# Patient Record
Sex: Female | Born: 1960 | Race: White | Hispanic: No | Marital: Married | Smoking: Never smoker
Health system: Southern US, Community
[De-identification: ages and names within clinical notes are randomized; demographics above are authoritative.]

## PROBLEM LIST (undated history)

## (undated) DIAGNOSIS — C3490 Malignant neoplasm of unspecified part of unspecified bronchus or lung: Secondary | ICD-10-CM

## (undated) DIAGNOSIS — E079 Disorder of thyroid, unspecified: Secondary | ICD-10-CM

## (undated) DIAGNOSIS — J45909 Unspecified asthma, uncomplicated: Secondary | ICD-10-CM

## (undated) DIAGNOSIS — N3010 Interstitial cystitis (chronic) without hematuria: Secondary | ICD-10-CM

## (undated) DIAGNOSIS — R011 Cardiac murmur, unspecified: Secondary | ICD-10-CM

## (undated) HISTORY — DX: Unspecified asthma, uncomplicated: J45.909

## (undated) HISTORY — DX: Malignant neoplasm of unspecified part of unspecified bronchus or lung: C34.90

## (undated) HISTORY — PX: ABDOMINAL HYSTERECTOMY: SHX81

## (undated) HISTORY — DX: Cardiac murmur, unspecified: R01.1

## (undated) HISTORY — DX: Disorder of thyroid, unspecified: E07.9

## (undated) HISTORY — PX: BREAST SURGERY: SHX581

## (undated) HISTORY — DX: Interstitial cystitis (chronic) without hematuria: N30.10

---

## 2003-09-19 ENCOUNTER — Encounter: Admission: RE | Admit: 2003-09-19 | Discharge: 2003-12-18 | Payer: Self-pay | Admitting: Anesthesiology

## 2003-12-17 ENCOUNTER — Encounter
Admission: RE | Admit: 2003-12-17 | Discharge: 2004-03-16 | Payer: Self-pay | Admitting: Physical Medicine and Rehabilitation

## 2005-09-05 HISTORY — PX: CHOLECYSTECTOMY: SHX55

## 2007-09-06 HISTORY — PX: LUNG SURGERY: SHX703

## 2011-01-06 ENCOUNTER — Ambulatory Visit: Payer: Self-pay | Admitting: Cardiothoracic Surgery

## 2011-02-04 ENCOUNTER — Ambulatory Visit: Payer: Self-pay | Admitting: Cardiothoracic Surgery

## 2012-01-12 ENCOUNTER — Ambulatory Visit: Payer: Self-pay | Admitting: Cardiothoracic Surgery

## 2012-02-04 ENCOUNTER — Ambulatory Visit: Payer: Self-pay | Admitting: Cardiothoracic Surgery

## 2012-07-26 ENCOUNTER — Ambulatory Visit: Payer: Self-pay | Admitting: Cardiothoracic Surgery

## 2013-09-26 ENCOUNTER — Ambulatory Visit: Payer: Self-pay | Admitting: Cardiothoracic Surgery

## 2013-10-06 ENCOUNTER — Ambulatory Visit: Payer: Self-pay | Admitting: Cardiothoracic Surgery

## 2014-11-06 ENCOUNTER — Ambulatory Visit
Admit: 2014-11-06 | Disposition: A | Discharge status: Home / Self Care | Payer: Self-pay | Attending: Cardiothoracic Surgery | Admitting: Cardiothoracic Surgery

## 2014-11-06 ENCOUNTER — Ambulatory Visit: Payer: Self-pay | Admitting: Cardiothoracic Surgery

## 2014-12-05 ENCOUNTER — Ambulatory Visit
Admit: 2014-12-05 | Disposition: A | Discharge status: Home / Self Care | Payer: Self-pay | Attending: Cardiothoracic Surgery | Admitting: Cardiothoracic Surgery

## 2015-10-13 ENCOUNTER — Telehealth: Payer: Self-pay | Admitting: Cardiothoracic Surgery

## 2015-10-13 NOTE — Telephone Encounter (Signed)
Patient has called and stated that she recently seen her PCP Launa Grill) and was told that she could possibly have liver cancer due to lab work that was recently done --- Alkaline: 133  ALT: 42. Patient is very concerned. She has been seeing you for lung cancer. She would like for Dr Genevive Bi to call her to share his thoughts. If you dont mind contacting her at [phone] at your convenience.

## 2015-10-14 ENCOUNTER — Telehealth: Payer: Self-pay

## 2015-10-14 NOTE — Telephone Encounter (Signed)
  Oncology Nurse Navigator Documentation  Navigator Location: [facility] (10/14/15 1600) Navigator Encounter Type: Telephone (10/14/15 1600)                   Interventions: Referrals;Coordination of Care (10/14/15 1600)                      Time Spent with Patient: 30 (10/14/15 1600)   Called office of Dr Launa Grill in [place], [phone]). Spoke with Legrand Como who states she had an appt yesterday to go over a CT scan and had elevated liver enzymes. He reports that the referral is not batched and ready to be sent out. He will send to my attention. Also requested that a copy of CT be obtained and sent with patient.

## 2015-10-15 ENCOUNTER — Telehealth: Payer: Self-pay

## 2015-10-15 NOTE — Telephone Encounter (Signed)
Oncology Nurse Navigator Documentation  Oncology Nurse Navigator Flowsheets 10/14/2015 10/15/2015  Navigator Location [facility]  Navigator Encounter Type Telephone Telephone  Interventions Referrals;Coordination of Care Referrals  Time Spent with Patient 30 15   Notes obtained from referring MD for Dr Genevive Bi. Appt made and patient notified of appt 2/16/117 at 1445

## 2015-10-22 ENCOUNTER — Ambulatory Visit: Payer: Self-pay | Admitting: Cardiothoracic Surgery

## 2015-10-26 ENCOUNTER — Encounter: Payer: Self-pay | Admitting: Oncology

## 2015-10-26 ENCOUNTER — Inpatient Hospital Stay: Payer: Self-pay | Attending: Oncology | Admitting: Oncology

## 2015-10-26 VITALS — BP 127/85 | HR 77 | Temp 98.1°F | Resp 16 | Wt 191.8 lb

## 2015-10-26 DIAGNOSIS — Z902 Acquired absence of lung [part of]: Secondary | ICD-10-CM | POA: Insufficient documentation

## 2015-10-26 DIAGNOSIS — Z9049 Acquired absence of other specified parts of digestive tract: Secondary | ICD-10-CM | POA: Insufficient documentation

## 2015-10-26 DIAGNOSIS — R748 Abnormal levels of other serum enzymes: Secondary | ICD-10-CM | POA: Insufficient documentation

## 2015-10-26 DIAGNOSIS — E079 Disorder of thyroid, unspecified: Secondary | ICD-10-CM | POA: Insufficient documentation

## 2015-10-26 DIAGNOSIS — J45909 Unspecified asthma, uncomplicated: Secondary | ICD-10-CM | POA: Insufficient documentation

## 2015-10-26 DIAGNOSIS — Z90710 Acquired absence of both cervix and uterus: Secondary | ICD-10-CM | POA: Insufficient documentation

## 2015-10-26 DIAGNOSIS — Z85118 Personal history of other malignant neoplasm of bronchus and lung: Secondary | ICD-10-CM | POA: Insufficient documentation

## 2015-10-26 DIAGNOSIS — Z79899 Other long term (current) drug therapy: Secondary | ICD-10-CM | POA: Insufficient documentation

## 2015-10-26 NOTE — Progress Notes (Signed)
Patient is an established patient of Dr. Genevive Bi for f/u lung cancer.  On recent lab check by PCP her liver functions were elevated and they would like for her to be evaluated for "possible liver cancer".

## 2015-10-30 NOTE — Progress Notes (Signed)
[place]  [phone] [phone]  ID: Jacqueline Mcneil OB: May 12, 1961  MR#: [id number]  HFW#:[id number]  Patient Care Team: Launa Grill, MD as PCP - General  CHIEF COMPLAINT:  Chief Complaint  Patient presents with  . New Evaluation    elevated liver functions    INTERVAL HISTORY: Patient is a 55 year old female with a distant history of lung cancer status post resection in 2009. Patient recently was found to have abnormal liver enzymes and has been referred for further evaluation and to assess for possible recurrence of disease. She currently feels well and is asymptomatic. She has no neurologic complaint. She has a good appetite and denies weight loss. She denies any recent fevers or illnesses. She has no chest pain, cough, shortness of breath, or hemoptysis. She denies any nausea, vomiting, constipation, or diarrhea. She has no urinary complaints. Patient feels at her baseline and offers no specific complaints today.  REVIEW OF SYSTEMS:   Review of Systems  Constitutional: Negative.  Negative for fever, weight loss and malaise/fatigue.  Respiratory: Negative.  Negative for cough, hemoptysis and shortness of breath.   Cardiovascular: Negative.  Negative for chest pain.  Gastrointestinal: Negative.  Negative for abdominal pain.  Genitourinary: Negative.   Musculoskeletal: Negative.   Neurological: Negative.  Negative for weakness.    As per HPI. Otherwise, a complete review of systems is negatve.  PAST MEDICAL HISTORY: Past Medical History  Diagnosis Date  . Asthma   . Lung cancer ([place])   . Heart murmur   . Thyroid disease   . Interstitial cystitis     PAST SURGICAL HISTORY: Past Surgical History  Procedure Laterality Date  . Breast surgery  [id number]  . Abdominal hysterectomy    . Cholecystectomy  2007  . Lung surgery  2009    FAMILY HISTORY: Reviewed and unchanged. No reported history of malignancy or chronic  disease.     ADVANCED DIRECTIVES:    HEALTH MAINTENANCE: Social History  Substance Use Topics  . Smoking status: Never Smoker   . Smokeless tobacco: Never Used  . Alcohol Use: No     Colonoscopy:  PAP:  Bone density:  Lipid panel:  Allergies  Allergen Reactions  . Latex Rash  . Penicillins Rash and Swelling    Current Outpatient Prescriptions  Medication Sig Dispense Refill  . albuterol (PROVENTIL) (5 MG/ML) 0.5% nebulizer solution Inhale into the lungs.    . cyclobenzaprine (FLEXERIL) 5 MG tablet 1 TABLET THREE TIMES A DAY ORALLY 30 DAYS  0  . hydrOXYzine (VISTARIL) 25 MG capsule TAKE 1 TO 2 CAPSULES EVERY 6 HOURS AS NEEDED FOR MUSCLE SPASMS.  1  . levothyroxine (SYNTHROID, LEVOTHROID) 25 MCG tablet TAKE 1 TABLET ONCE A DAY ORAL 90 DAYS  1  . sertraline (ZOLOFT) 25 MG tablet 1 TABLET TWICE A DAY ORALLY 30 DAYS  6  . traZODone (DESYREL) 100 MG tablet TAKE 1 TABLET BEDTIME BEDTIME ORAL 90  1   No current facility-administered medications for this visit.    OBJECTIVE: Filed Vitals:   10/26/15 1522  BP: 127/85  Pulse: 77  Temp: 98.1 F (36.7 C)  Resp: 16     There is no height on file to calculate BMI.    ECOG FS:0 - Asymptomatic  General: Well-developed, well-nourished, no acute distress. Eyes: Pink conjunctiva, anicteric sclera. HEENT: Normocephalic, moist mucous membranes, clear oropharnyx. Lungs: Clear to auscultation bilaterally. Heart: Regular rate and rhythm. No rubs, murmurs, or gallops. Abdomen: Soft, nontender, nondistended.  No organomegaly noted, normoactive bowel sounds. Musculoskeletal: No edema, cyanosis, or clubbing. Neuro: Alert, answering all questions appropriately. Cranial nerves grossly intact. Skin: No rashes or petechiae noted. Psych: Normal affect. Lymphatics: No cervical, calvicular, axillary or inguinal LAD.   LAB RESULTS:  No results found for: NA, K, CL, CO2, GLUCOSE, BUN, CREATININE, CALCIUM, PROT, ALBUMIN, AST, ALT, ALKPHOS,  BILITOT, GFRNONAA, GFRAA  No results found for: WBC, NEUTROABS, HGB, HCT, MCV, PLT   STUDIES: No results found.  ASSESSMENT: History of lung cancer, abnormal liver enzymes.  PLAN:    1. Lung cancer: Patient had resection in 2009 with no evidence of recurrence. Given her abnormal liver functions, will repeat CT scan for restaging purposes which will include an abdominal and pelvic CT to further evaluate her liver. We will also repeat liver function tests at next clinic visit. Return to clinic on November 12, 2015 for further evaluation by both medical oncology and surgical oncology. If patient's CT scan is within normal limits, she likely can be discharged from clinic.  Patient expressed understanding and was in agreement with this plan. She also understands that She can call clinic at any time with any questions, concerns, or complaints.    Lloyd Huger, MD   10/30/2015 2:51 PM

## 2015-11-12 ENCOUNTER — Ambulatory Visit: Payer: Self-pay | Admitting: Cardiothoracic Surgery

## 2015-11-12 ENCOUNTER — Inpatient Hospital Stay: Payer: Self-pay | Admitting: Cardiothoracic Surgery

## 2015-11-12 ENCOUNTER — Inpatient Hospital Stay: Payer: Self-pay | Admitting: Oncology

## 2015-11-12 ENCOUNTER — Ambulatory Visit
Admission: RE | Admit: 2015-11-12 | Discharge: 2015-11-12 | Disposition: A | Discharge status: Home / Self Care | Payer: Self-pay | Source: Ambulatory Visit | Attending: Oncology | Admitting: Oncology

## 2015-11-12 ENCOUNTER — Ambulatory Visit: Payer: Self-pay | Admitting: Oncology

## 2015-11-26 ENCOUNTER — Inpatient Hospital Stay: Payer: Self-pay | Attending: Cardiothoracic Surgery | Admitting: Cardiothoracic Surgery

## 2015-11-26 ENCOUNTER — Ambulatory Visit
Admission: RE | Admit: 2015-11-26 | Discharge: 2015-11-26 | Disposition: A | Discharge status: Home / Self Care | Payer: Self-pay | Source: Ambulatory Visit | Attending: Oncology | Admitting: Oncology

## 2015-11-26 ENCOUNTER — Inpatient Hospital Stay (HOSPITAL_BASED_OUTPATIENT_CLINIC_OR_DEPARTMENT_OTHER): Payer: Self-pay | Admitting: Oncology

## 2015-11-26 VITALS — BP 145/92 | HR 90 | Temp 97.4°F | Resp 16 | Wt 192.7 lb

## 2015-11-26 DIAGNOSIS — R911 Solitary pulmonary nodule: Secondary | ICD-10-CM | POA: Insufficient documentation

## 2015-11-26 DIAGNOSIS — Z85118 Personal history of other malignant neoplasm of bronchus and lung: Secondary | ICD-10-CM | POA: Insufficient documentation

## 2015-11-26 DIAGNOSIS — R748 Abnormal levels of other serum enzymes: Secondary | ICD-10-CM

## 2015-11-26 DIAGNOSIS — N200 Calculus of kidney: Secondary | ICD-10-CM | POA: Insufficient documentation

## 2015-11-26 DIAGNOSIS — K760 Fatty (change of) liver, not elsewhere classified: Secondary | ICD-10-CM | POA: Insufficient documentation

## 2015-11-26 DIAGNOSIS — Z902 Acquired absence of lung [part of]: Secondary | ICD-10-CM | POA: Insufficient documentation

## 2015-11-26 DIAGNOSIS — Z79899 Other long term (current) drug therapy: Secondary | ICD-10-CM

## 2015-11-26 MED ORDER — IOPAMIDOL (ISOVUE-300) INJECTION 61%
100.0000 mL | Freq: Once | INTRAVENOUS | Status: AC | PRN
  Administered 2015-11-26: 100 mL via INTRAVENOUS

## 2015-11-26 NOTE — Progress Notes (Signed)
Patient does not offer any problems today.  

## 2015-12-11 NOTE — Progress Notes (Signed)
[place]  [phone] [phone]  ID: Jacqueline Mcneil OB: 1960-10-02  MR#: [id number]  UUV#:[id number]  Patient Care Team: Evelene Croon, MD as PCP - General  CHIEF COMPLAINT:  Chief Complaint  Patient presents with  . Results    INTERVAL HISTORY: Patient returns to clinic today for further evaluation and discussion of her imaging results. She continues to feel well and is asymptomatic. She has no neurologic complaint. She has a good appetite and denies weight loss. She denies any recent fevers or illnesses. She has no chest pain, cough, shortness of breath, or hemoptysis. She denies any nausea, vomiting, constipation, or diarrhea. She has no urinary complaints. Patient feels at her baseline and offers no specific complaints today.  REVIEW OF SYSTEMS:   Review of Systems  Constitutional: Negative.  Negative for fever, weight loss and malaise/fatigue.  Respiratory: Negative.  Negative for cough, hemoptysis and shortness of breath.   Cardiovascular: Negative.  Negative for chest pain.  Gastrointestinal: Negative.  Negative for abdominal pain.  Genitourinary: Negative.   Musculoskeletal: Negative.   Neurological: Negative.  Negative for weakness.    As per HPI. Otherwise, a complete review of systems is negatve.  PAST MEDICAL HISTORY: Past Medical History  Diagnosis Date  . Asthma   . Lung cancer ([place])   . Heart murmur   . Thyroid disease   . Interstitial cystitis     PAST SURGICAL HISTORY: Past Surgical History  Procedure Laterality Date  . Breast surgery  [id number]  . Abdominal hysterectomy    . Cholecystectomy  2007  . Lung surgery  2009    FAMILY HISTORY: Reviewed and unchanged. No reported history of malignancy or chronic disease.     ADVANCED DIRECTIVES:    HEALTH MAINTENANCE: Social History  Substance Use Topics  . Smoking status: Never Smoker   . Smokeless tobacco: Never Used  . Alcohol Use: No      Colonoscopy:  PAP:  Bone density:  Lipid panel:  Allergies  Allergen Reactions  . Latex Rash  . Penicillins Rash and Swelling    Current Outpatient Prescriptions  Medication Sig Dispense Refill  . albuterol (PROVENTIL) (5 MG/ML) 0.5% nebulizer solution Inhale into the lungs.    . cyclobenzaprine (FLEXERIL) 5 MG tablet 1 TABLET THREE TIMES A DAY ORALLY 30 DAYS  0  . hydrOXYzine (VISTARIL) 25 MG capsule TAKE 1 TO 2 CAPSULES EVERY 6 HOURS AS NEEDED FOR MUSCLE SPASMS.  1  . levothyroxine (SYNTHROID, LEVOTHROID) 25 MCG tablet TAKE 1 TABLET ONCE A DAY ORAL 90 DAYS  1  . sertraline (ZOLOFT) 25 MG tablet 1 TABLET TWICE A DAY ORALLY 30 DAYS  6  . traZODone (DESYREL) 100 MG tablet TAKE 1 TABLET BEDTIME BEDTIME ORAL 90  1   No current facility-administered medications for this visit.    OBJECTIVE: Filed Vitals:   11/26/15 1341  BP: 145/92  Pulse: 90  Temp: 97.4 F (36.3 C)  Resp: 16     There is no height on file to calculate BMI.    ECOG FS:0 - Asymptomatic  General: Well-developed, well-nourished, no acute distress. Eyes: Pink conjunctiva, anicteric sclera. Lungs: Clear to auscultation bilaterally. Heart: Regular rate and rhythm. No rubs, murmurs, or gallops. Abdomen: Soft, nontender, nondistended. No organomegaly noted, normoactive bowel sounds. Musculoskeletal: No edema, cyanosis, or clubbing. Neuro: Alert, answering all questions appropriately. Cranial nerves grossly intact. Skin: No rashes or petechiae noted. Psych: Normal affect.   LAB RESULTS:  No results found  for: NA, K, CL, CO2, GLUCOSE, BUN, CREATININE, CALCIUM, PROT, ALBUMIN, AST, ALT, ALKPHOS, BILITOT, GFRNONAA, GFRAA  No results found for: WBC, NEUTROABS, HGB, HCT, MCV, PLT   STUDIES: Ct Chest W Contrast  11/26/2015  CLINICAL DATA:  Lung cancer diagnosed in 2007, recent abnormal elevation of liver enzymes. EXAM: CT CHEST AND ABDOMEN WITHOUT CONTRAST TECHNIQUE: Multidetector CT imaging of the chest and  abdomen was performed following the standard protocol without intravenous contrast. COMPARISON:  Multiple exams, including 11/06/2014 FINDINGS: CT CHEST FINDINGS Mediastinum/Nodes: Trace fluid in the superior pericardial recesses. There is some oral contrast medium in the thoracic esophagus which could be due to dysmotility or gastroesophageal reflux. No pathologic thoracic adenopathy. Lungs/Pleura: Questionable 2 mm nodule in the right upper lobe image 19/4, no change from 01/06/2011, benign. Prior left upper lobectomy. 4 mm peripheral nodule in the left lower lobe image 21/4, no change from 01/06/2011, benign. Minimal scarring in the left lung apex. Minimal scarring lateral to the left cardiac margin, no change from 2012. Musculoskeletal: Mild thoracic spondylosis. CT ABDOMEN FINDINGS Hepatobiliary: Mild diffuse hepatic steatosis. No focal liver lesion. Cholecystectomy. Pancreas: Unremarkable Spleen: Unremarkable.  Several accessory spleens incidentally noted. Adrenals/Urinary Tract: Adrenal glands normal. 2 mm nonobstructive right mid kidney calculus, image 60/2. Right greater than left extrarenal pelvis incidentally noted. Stomach/Bowel: Unremarkable where visualized. Vascular/Lymphatic: Unremarkable Other: No supplemental non-categorized findings. Musculoskeletal: Diffuse disc bulge at the L4-5 level. IMPRESSION: 1. No findings of recurrent malignancy. 2. Mild diffuse hepatic steatosis. No focal liver lesion identified. 3. 2 mm nonobstructive right mid kidney calculus. 4. Diffuse disc bulge at L4-5. 5. The patient has a small peripheral left lower lobe nodule on image 21 series 4 which is unchanged from 2012 and benign. Prior left upper lobectomy. Electronically Signed   By: Van Clines M.D.   On: 11/26/2015 14:30   Ct Abdomen W Contrast  11/26/2015  CLINICAL DATA:  Lung cancer diagnosed in 2007, recent abnormal elevation of liver enzymes. EXAM: CT CHEST AND ABDOMEN WITHOUT CONTRAST TECHNIQUE:  Multidetector CT imaging of the chest and abdomen was performed following the standard protocol without intravenous contrast. COMPARISON:  Multiple exams, including 11/06/2014 FINDINGS: CT CHEST FINDINGS Mediastinum/Nodes: Trace fluid in the superior pericardial recesses. There is some oral contrast medium in the thoracic esophagus which could be due to dysmotility or gastroesophageal reflux. No pathologic thoracic adenopathy. Lungs/Pleura: Questionable 2 mm nodule in the right upper lobe image 19/4, no change from 01/06/2011, benign. Prior left upper lobectomy. 4 mm peripheral nodule in the left lower lobe image 21/4, no change from 01/06/2011, benign. Minimal scarring in the left lung apex. Minimal scarring lateral to the left cardiac margin, no change from 2012. Musculoskeletal: Mild thoracic spondylosis. CT ABDOMEN FINDINGS Hepatobiliary: Mild diffuse hepatic steatosis. No focal liver lesion. Cholecystectomy. Pancreas: Unremarkable Spleen: Unremarkable.  Several accessory spleens incidentally noted. Adrenals/Urinary Tract: Adrenal glands normal. 2 mm nonobstructive right mid kidney calculus, image 60/2. Right greater than left extrarenal pelvis incidentally noted. Stomach/Bowel: Unremarkable where visualized. Vascular/Lymphatic: Unremarkable Other: No supplemental non-categorized findings. Musculoskeletal: Diffuse disc bulge at the L4-5 level. IMPRESSION: 1. No findings of recurrent malignancy. 2. Mild diffuse hepatic steatosis. No focal liver lesion identified. 3. 2 mm nonobstructive right mid kidney calculus. 4. Diffuse disc bulge at L4-5. 5. The patient has a small peripheral left lower lobe nodule on image 21 series 4 which is unchanged from 2012 and benign. Prior left upper lobectomy. Electronically Signed   By: Van Clines M.D.   On: 11/26/2015  14:30    ASSESSMENT: History of lung cancer, abnormal liver enzymes.  PLAN:    1. Lung cancer: Patient had resection in 2009 with no evidence of  recurrence. CT scan results reviewed independently and reported as above with no obvious evidence of recurrence for malignancy. No further imaging or follow-up is necessary. Please refer patient back if there are any questions or concerns.   Patient expressed understanding and was in agreement with this plan. She also understands that She can call clinic at any time with any questions, concerns, or complaints.    Lloyd Huger, MD   12/11/2015 2:33 PM

## 2016-07-17 IMAGING — CT CT ABDOMEN W/ CM
1 series · 15 of 32 positions shown, 19 images · non-contrast
Comparison: Multiple exams, including 11/06/2014

CLINICAL DATA: Lung cancer diagnosed in 5558, recent abnormal
elevation of liver enzymes.

EXAM:
CT CHEST AND ABDOMEN WITHOUT CONTRAST
TECHNIQUE: Multidetector CT imaging of the chest and abdomen was performed
following the standard protocol without intravenous contrast.

[Series 8: delay · axial · delayed · 0.78mm/px · z∈[-444,-264]mm · 15 of 41 slices shown, 19 images]
[im 3/41  soft-tissue]
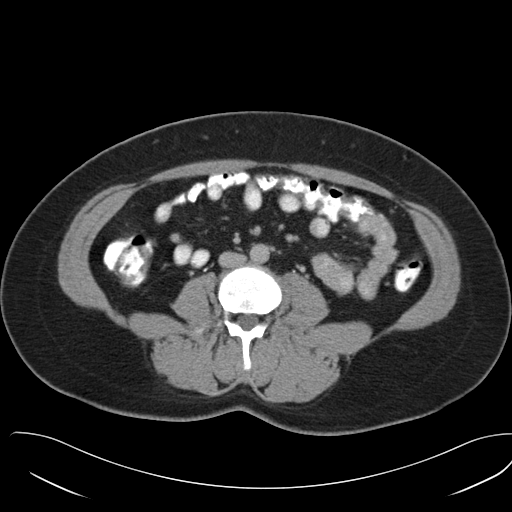
[im 3/41  bone]
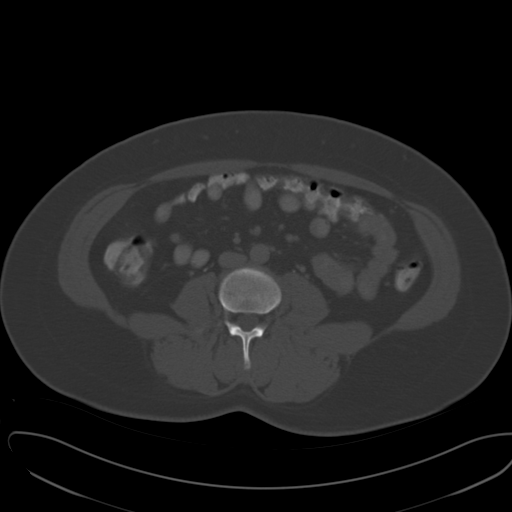
[im 6/41  soft-tissue]
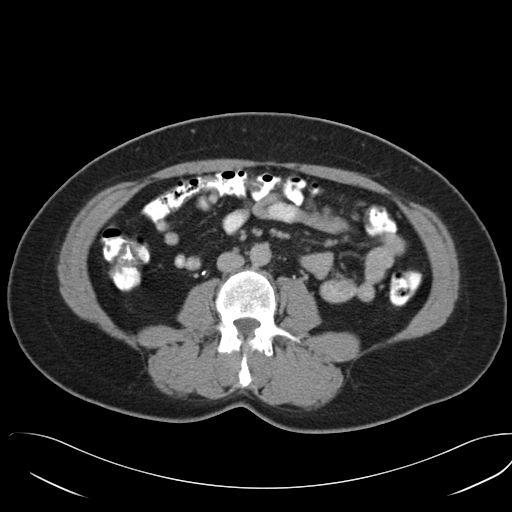
[im 8/41  soft-tissue]
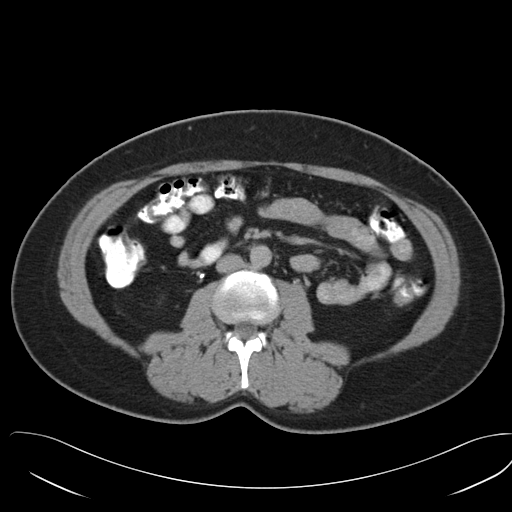
[im 12/41  soft-tissue]
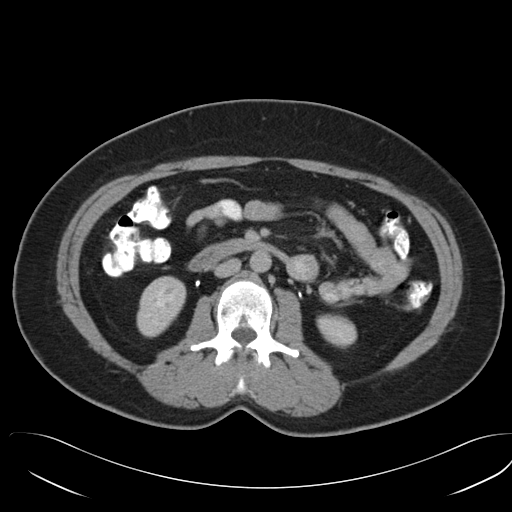
[im 15/41  soft-tissue]
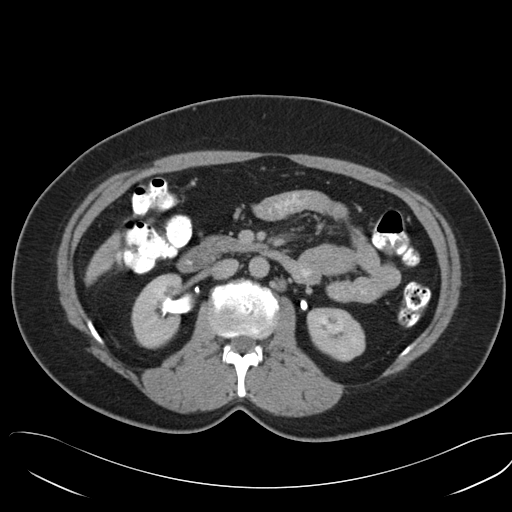
[im 17/41  soft-tissue]
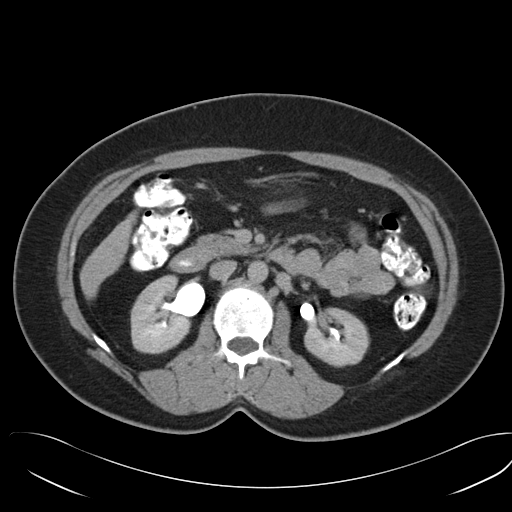
[im 21/41  soft-tissue]
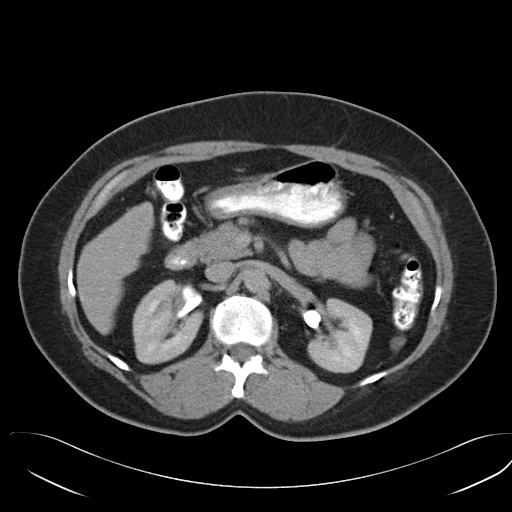
[im 24/41  soft-tissue]
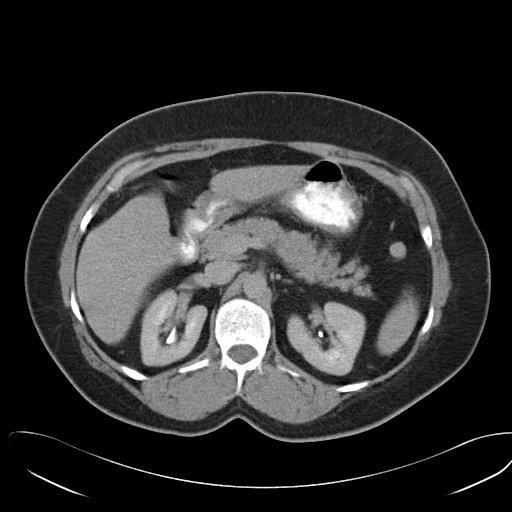
[im 26/41  soft-tissue]
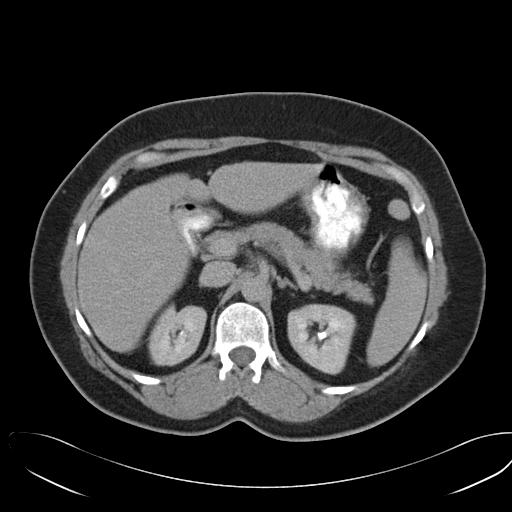
[im 26/41  bone]
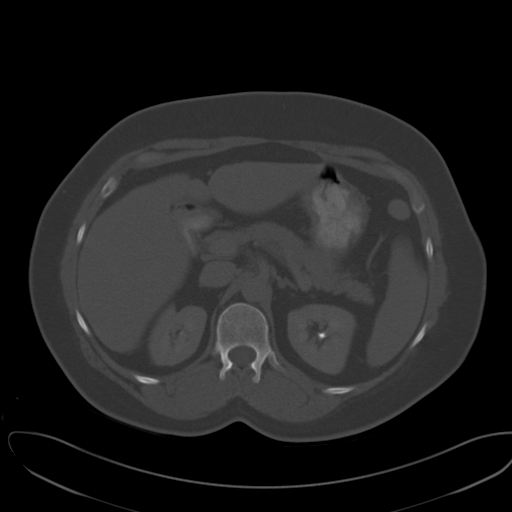
[im 29/41  soft-tissue]
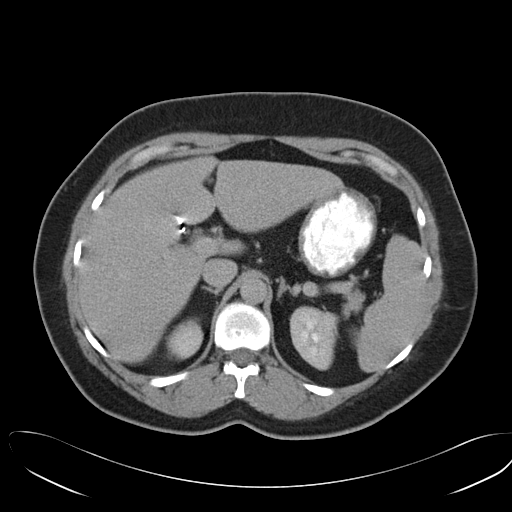
[im 33/41  soft-tissue]
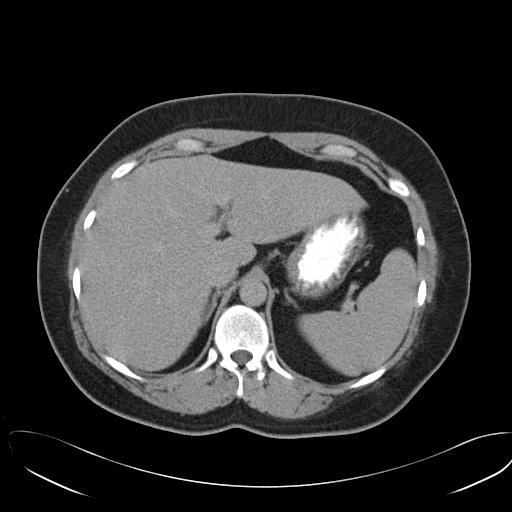
[im 35/41  soft-tissue]
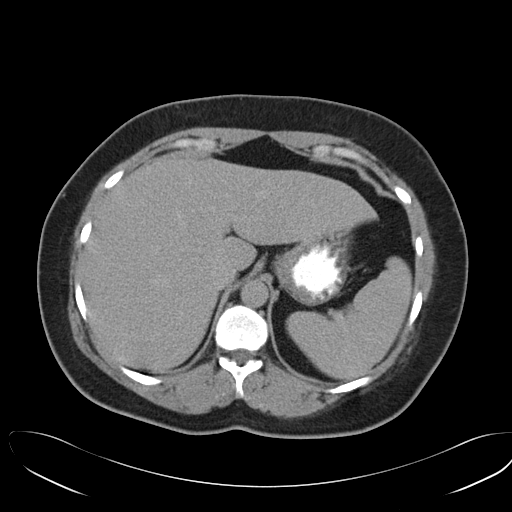
[im 35/41  lung]
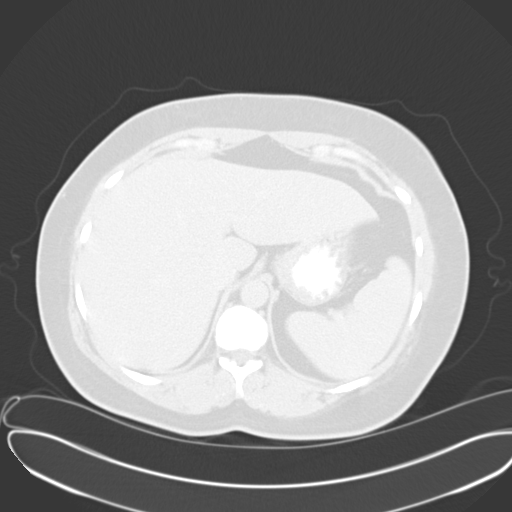
[im 37/41  lung]
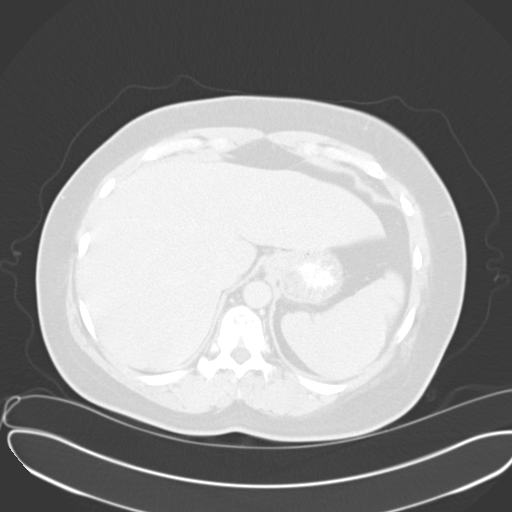
[im 38/41  soft-tissue]
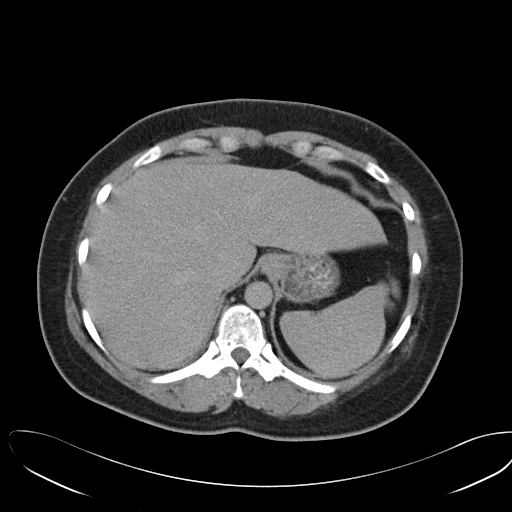
[im 38/41  lung]
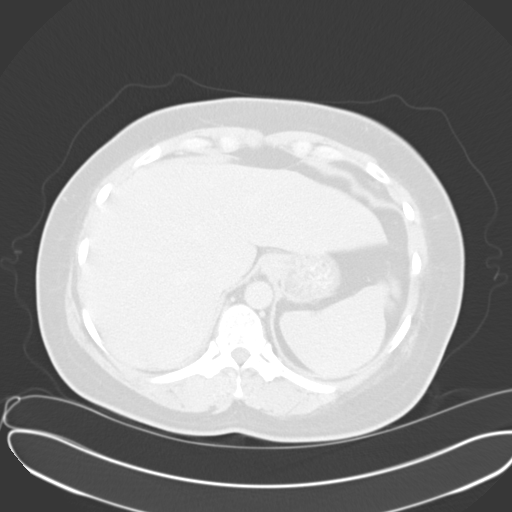
[im 39/41  lung]
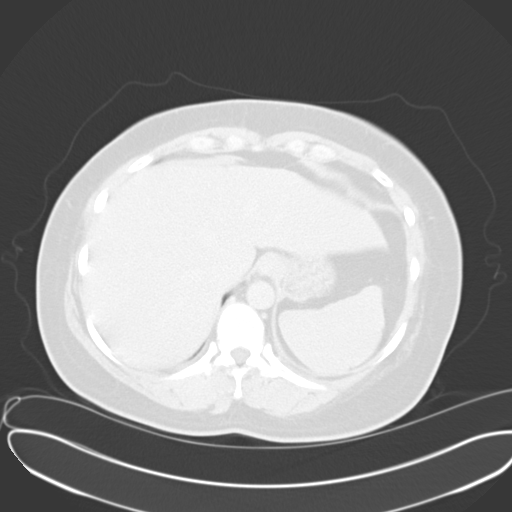

[15 of 32 positions shown; findings below may reference images not displayed]

FINDINGS: CT CHEST FINDINGS

Mediastinum/Nodes: Trace fluid in the superior pericardial recesses.
There is some oral contrast medium in the thoracic esophagus which
could be due to dysmotility or gastroesophageal reflux. No
pathologic thoracic adenopathy.

Lungs/Pleura: Questionable 2 mm nodule in the right upper lobe image
[DATE], no change from 01/06/2011, benign.

Prior left upper lobectomy. 4 mm peripheral nodule in the left lower
lobe image [DATE], no change from 01/06/2011, benign. Minimal scarring
in the left lung apex. Minimal scarring lateral to the left cardiac
margin, no change from 3223.

Musculoskeletal: Mild thoracic spondylosis.

CT ABDOMEN FINDINGS

Hepatobiliary: Mild diffuse hepatic steatosis. No focal liver
lesion. Cholecystectomy.

Pancreas: Unremarkable

Spleen: Unremarkable.  Several accessory spleens incidentally noted.

Adrenals/Urinary Tract: Adrenal glands normal. 2 mm nonobstructive
right mid kidney calculus, image 60/2. Right greater than left
extrarenal pelvis incidentally noted.

Stomach/Bowel: Unremarkable where visualized.

Vascular/Lymphatic: Unremarkable

Other: No supplemental non-categorized findings.

Musculoskeletal: Diffuse disc bulge at the L4-5 level.
IMPRESSION: 1. No findings of recurrent malignancy.
2. Mild diffuse hepatic steatosis. No focal liver lesion identified.
3. 2 mm nonobstructive right mid kidney calculus.
4. Diffuse disc bulge at L4-5.
5. The patient has a small peripheral left lower lobe nodule on
image 21 series 4 which is unchanged from 3223 and benign. Prior
left upper lobectomy.

## 2018-07-16 ENCOUNTER — Ambulatory Visit
Admission: RE | Admit: 2018-07-16 | Discharge: 2018-07-16 | Disposition: A | Discharge status: Home / Self Care | Payer: Self-pay | Source: Ambulatory Visit | Attending: Cardiothoracic Surgery | Admitting: Cardiothoracic Surgery

## 2018-07-16 ENCOUNTER — Other Ambulatory Visit: Payer: Self-pay | Admitting: Cardiothoracic Surgery

## 2018-07-16 DIAGNOSIS — R918 Other nonspecific abnormal finding of lung field: Secondary | ICD-10-CM

## 2018-07-20 ENCOUNTER — Ambulatory Visit
Admission: RE | Admit: 2018-07-20 | Discharge: 2018-07-20 | Disposition: A | Discharge status: Home / Self Care | Payer: Self-pay | Source: Ambulatory Visit | Attending: Cardiothoracic Surgery | Admitting: Cardiothoracic Surgery

## 2018-07-20 ENCOUNTER — Other Ambulatory Visit: Payer: Self-pay | Admitting: Cardiothoracic Surgery

## 2018-07-20 DIAGNOSIS — R918 Other nonspecific abnormal finding of lung field: Secondary | ICD-10-CM

## 2018-07-27 ENCOUNTER — Ambulatory Visit
Admission: RE | Admit: 2018-07-27 | Discharge: 2018-07-27 | Disposition: A | Discharge status: Home / Self Care | Payer: Self-pay | Source: Ambulatory Visit | Attending: Cardiothoracic Surgery | Admitting: Cardiothoracic Surgery

## 2018-07-27 ENCOUNTER — Other Ambulatory Visit: Payer: Self-pay | Admitting: Cardiothoracic Surgery

## 2018-07-27 DIAGNOSIS — R918 Other nonspecific abnormal finding of lung field: Secondary | ICD-10-CM

## 2018-07-30 ENCOUNTER — Ambulatory Visit
Admission: RE | Admit: 2018-07-30 | Discharge: 2018-07-30 | Disposition: A | Discharge status: Home / Self Care | Payer: Self-pay | Source: Ambulatory Visit | Attending: Cardiothoracic Surgery | Admitting: Cardiothoracic Surgery

## 2018-07-30 ENCOUNTER — Other Ambulatory Visit: Payer: Self-pay | Admitting: Cardiothoracic Surgery

## 2018-07-30 DIAGNOSIS — R918 Other nonspecific abnormal finding of lung field: Secondary | ICD-10-CM

## 2019-05-06 ENCOUNTER — Telehealth: Payer: Self-pay | Admitting: Cardiothoracic Surgery

## 2019-05-06 NOTE — Telephone Encounter (Signed)
Patient is calling and said she had went to a Psychiatric nurse and they removed a place on her head and is waiting on the biopsy results they mention to something about lung cancer that she had could effect, the patient is concerned on what they told her and is waiting to see Dr. Genevive Bi for a second opinion. Please call patient and advise.

## 2019-05-06 NOTE — Telephone Encounter (Signed)
Patient called and wanted to know if her lung cancer had anything to do with skin cancer. Talked with [facility] and the answer was no . I told patient that per Freda Munro.
